# Patient Record
Sex: Male | Born: 1970
Health system: Southern US, Community
[De-identification: ages and names within clinical notes are randomized; demographics above are authoritative.]

## PROBLEM LIST (undated history)

## (undated) DIAGNOSIS — K5000 Crohn's disease of small intestine without complications: Secondary | ICD-10-CM

## (undated) DIAGNOSIS — D649 Anemia, unspecified: Secondary | ICD-10-CM

## (undated) DIAGNOSIS — K529 Noninfective gastroenteritis and colitis, unspecified: Secondary | ICD-10-CM

## (undated) DIAGNOSIS — E538 Deficiency of other specified B group vitamins: Secondary | ICD-10-CM

## (undated) HISTORY — DX: Anemia, unspecified: D64.9

## (undated) HISTORY — DX: Deficiency of other specified B group vitamins: E53.8

## (undated) HISTORY — DX: Crohn's disease of small intestine without complications: K50.00

## (undated) HISTORY — DX: Noninfective gastroenteritis and colitis, unspecified: K52.9

## (undated) HISTORY — PX: COLONOSCOPY: SHX174

---

## 2014-05-20 ENCOUNTER — Other Ambulatory Visit: Payer: Self-pay

## 2014-05-20 DIAGNOSIS — K50119 Crohn's disease of large intestine with unspecified complications: Secondary | ICD-10-CM

## 2014-05-25 ENCOUNTER — Other Ambulatory Visit (INDEPENDENT_AMBULATORY_CARE_PROVIDER_SITE_OTHER): Payer: 59

## 2014-05-25 DIAGNOSIS — K5010 Crohn's disease of large intestine without complications: Secondary | ICD-10-CM

## 2014-05-25 LAB — CBC WITH DIFFERENTIAL/PLATELET
BASOS PCT: 0.5 % (ref 0.0–3.0)
Basophils Absolute: 0 10*3/uL (ref 0.0–0.1)
EOS ABS: 0.2 10*3/uL (ref 0.0–0.7)
EOS PCT: 3.2 % (ref 0.0–5.0)
HCT: 44.3 % (ref 39.0–52.0)
Hemoglobin: 14.9 g/dL (ref 13.0–17.0)
LYMPHS PCT: 27.2 % (ref 12.0–46.0)
Lymphs Abs: 1.5 10*3/uL (ref 0.7–4.0)
MCHC: 33.6 g/dL (ref 30.0–36.0)
MCV: 83 fl (ref 78.0–100.0)
MONO ABS: 0.4 10*3/uL (ref 0.1–1.0)
Monocytes Relative: 8 % (ref 3.0–12.0)
Neutro Abs: 3.4 10*3/uL (ref 1.4–7.7)
Neutrophils Relative %: 61.1 % (ref 43.0–77.0)
PLATELETS: 280 10*3/uL (ref 150.0–400.0)
RBC: 5.33 Mil/uL (ref 4.22–5.81)
RDW: 12.9 % (ref 11.5–15.5)
WBC: 5.6 10*3/uL (ref 4.0–10.5)

## 2014-05-25 LAB — COMPREHENSIVE METABOLIC PANEL
ALBUMIN: 4.1 g/dL (ref 3.5–5.2)
ALK PHOS: 53 U/L (ref 39–117)
ALT: 28 U/L (ref 0–53)
AST: 30 U/L (ref 0–37)
BUN: 10 mg/dL (ref 6–23)
CO2: 23 mEq/L (ref 19–32)
Calcium: 9.2 mg/dL (ref 8.4–10.5)
Chloride: 106 mEq/L (ref 96–112)
Creatinine, Ser: 1.1 mg/dL (ref 0.4–1.5)
GFR: 81.31 mL/min (ref >60.00)
GLUCOSE: 86 mg/dL (ref 70–99)
POTASSIUM: 4.1 meq/L (ref 3.5–5.1)
SODIUM: 138 meq/L (ref 135–145)
Total Bilirubin: 0.7 mg/dL (ref 0.2–1.2)
Total Protein: 7.2 g/dL (ref 6.0–8.3)

## 2014-05-25 LAB — C-REACTIVE PROTEIN: CRP: <0.5 mg/dL (ref 0.5–20.0)

## 2014-05-27 LAB — IBD EXPANDED PANEL
ACCA: 6 units (ref 0–90)
ALCA: 40 units (ref 0–60)
AMCA: 39 U (ref 0–100)
ATYPICAL PANCA: NEGATIVE
gASCA: 8 units (ref 0–50)

## 2014-06-04 ENCOUNTER — Encounter: Payer: Self-pay | Admitting: Internal Medicine

## 2014-06-04 ENCOUNTER — Ambulatory Visit: Payer: Self-pay | Admitting: Internal Medicine

## 2014-06-04 ENCOUNTER — Other Ambulatory Visit: Payer: 59

## 2014-06-04 ENCOUNTER — Ambulatory Visit (INDEPENDENT_AMBULATORY_CARE_PROVIDER_SITE_OTHER): Payer: 59 | Admitting: Internal Medicine

## 2014-06-04 VITALS — BP 128/70 | HR 76 | Ht 68.0 in | Wt 226.1 lb

## 2014-06-04 DIAGNOSIS — K529 Noninfective gastroenteritis and colitis, unspecified: Secondary | ICD-10-CM

## 2014-06-04 DIAGNOSIS — E538 Deficiency of other specified B group vitamins: Secondary | ICD-10-CM

## 2014-06-04 DIAGNOSIS — K5289 Other specified noninfective gastroenteritis and colitis: Secondary | ICD-10-CM

## 2014-06-04 NOTE — Patient Instructions (Signed)
Please go to the basement for lab work today before leaving.  We will be in touch with results and plans.   We are providing you with copies of your labwork that has been done.    I appreciate the opportunity to care for you.

## 2014-06-04 NOTE — Progress Notes (Signed)
   Subjective:    Patient ID: Bruce Harrison, male    DOB: [birth year], 43 y.o.   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 Initially in [place] then in  [place] he has an evaluation by gastroenterology that demonstrated aphthous ulcers in the small intestine or a small bowel capsule endoscopy as well as a colonoscopy. Biopsy showed nonspecific inflammation. He has had about 4 episodes over the years. He has also noticed an intermittent rash on his elbows it seems to be better with less weak and in general even with some wheat or most of the week but not necessarily gluten. He has never been tested for celiac disease. He was started on Pentasa a number of years ago and takes 2-3 g daily, and since then has only had one mild episode. Rare minor mouth ulcers Takes B12 because he was deficient.  No Known Allergies  Medications:  Pentasa 2-3 g daily Vit B12 1000 ug biweekly IM  Past Medical History  Diagnosis Date  . Vitamin B12 deficiency   . Anemia   . IBD (inflammatory bowel disease)     ? type   Past Surgical History  Procedure Laterality Date  . Colonoscopy     History   Social History  . Marital Status: Married    Spouse Name: N/A    Number of Children: 2  . Years of Education: N/A   Occupational History  . MD    Social History Main Topics  . Smoking status: Never Smoker   . Smokeless tobacco: Never Used  . Alcohol Use: No  . Drug Use: No  . Sexual Activity: None   Married, 2 sons MD [employer]  History reviewed.family history of heart disease.  Review of Systems This is positive for those things mentioned in the HPI. All other review of systems are negative.      Objective:   Physical Exam General:  Well-developed, well-nourished  and in no acute distress Eyes:  anicteric. ENT:   Mouth and posterior pharynx free of lesions.   Lungs: Clear to auscultation bilaterally. Heart:  S1S2, no rubs, murmurs, gallops. Abdomen:  soft, non-tender, no hepatosplenomegaly, hernia, or mass and BS+.  Rectal: Lymph:  no cervical or supraclavicular adenopathy. Extremities:   no edema Skin   no rash. Neuro:  A&O x 3.  Psych:  appropriate mood and  Affect.   Data Reviewed:  Labs in EMR     Assessment & Plan:  Inflammatory bowel disease ? Crohn's vs other indeterminant Reasonable to look for celiac disease Will call with labs Still need to review prior w/u Need to get ROI signed - forgot to do so

## 2014-06-06 ENCOUNTER — Encounter: Payer: Self-pay | Admitting: Internal Medicine

## 2014-06-06 DIAGNOSIS — E538 Deficiency of other specified B group vitamins: Secondary | ICD-10-CM | POA: Insufficient documentation

## 2014-06-06 DIAGNOSIS — K5000 Crohn's disease of small intestine without complications: Secondary | ICD-10-CM

## 2014-06-06 HISTORY — DX: Crohn's disease of small intestine without complications: K50.00

## 2014-06-06 NOTE — Assessment & Plan Note (Signed)
?   Crohn's vs other indeterminant Reasonable to look for celiac disease Will call with labs Still need to review prior w/u Need to get ROI signed - forgot to do so

## 2014-06-08 LAB — CELIAC DISEASE COMPLETE PANEL
Antigliadin Abs, IgA: 11 units (ref 0–19)
Gliadin IgG: 2 units (ref 0–19)
IGA/IMMUNOGLOBULIN A, SERUM: 214 mg/dL (ref 91–414)
Tissue Transglut Ab: 2 U/mL (ref 0–5)
Transglutaminase IgA: <2 U/mL (ref 0–3)

## 2014-06-08 NOTE — Progress Notes (Signed)
Quick Note:  His celiac panel is negative I neglected to get his old records when he was here - need ROI for colonoscopy, egd and pathology results from GI w/u's in [place] ______

## 2014-07-12 ENCOUNTER — Telehealth: Payer: Self-pay | Admitting: Internal Medicine

## 2014-07-12 DIAGNOSIS — K5000 Crohn's disease of small intestine without complications: Secondary | ICD-10-CM

## 2014-07-13 ENCOUNTER — Encounter: Payer: Self-pay | Admitting: Internal Medicine

## 2014-07-13 NOTE — Telephone Encounter (Signed)
Have reviewed his outside records and it shows he has had some very mild ileitis, by biopsy at least once if not twice. Nonspecific. In 2006 originally with normal-looking ileum and then aphthous ulcers in 2007 a colonoscopy. He seems to do well on a low dose mesalamine treatment and we discussed the pros and cons of stopping versus restarting it and whether or not endoscopic evaluation is needed at this point. My recommendations are that he continue his low-dose Pentasa see me every one to 2 years and does not need a routine colonoscopy at this point he should have one at 56 routine colon cancer screening and then evaluate signs and symptoms as appropriate otherwise.

## 2014-10-05 ENCOUNTER — Ambulatory Visit (INDEPENDENT_AMBULATORY_CARE_PROVIDER_SITE_OTHER): Payer: 59 | Admitting: Internal Medicine

## 2014-10-05 ENCOUNTER — Encounter: Payer: Self-pay | Admitting: Internal Medicine

## 2014-10-05 VITALS — BP 144/77 | HR 81 | Wt 226.0 lb

## 2014-10-05 DIAGNOSIS — Z7721 Contact with and (suspected) exposure to potentially hazardous body fluids: Secondary | ICD-10-CM

## 2014-10-05 NOTE — Progress Notes (Signed)
   Subjective:    Patient ID: Bruce Harrison, male    DOB: 13-Feb-1971, 43 y.o.   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 Carlean Purl, GERD. roughly 6 wks ago, while moonlighting at [place], [place], during his last shift, he was placing a femoral line and while suturing the line he noticed he pricked his gloved hand with needle. He did not notice any blood drawn from inside of the glove. The source was 43 yo patient died of sepsis of aspiration pneumonia. No hx of hepatitis or hiv, although no acute testing panel done at the time of the needle stick  First couple weeks feeling fine. Started having a day or two of malaise in setting of being with exposed to sick contact (2 sons and wife). He was wondered if he had any blood borne pathogen   Hep B imms., booster in [place] 2-33yrs Last tested for HIV and HCV, both negative roughly 3 yrs.   Hepatitis B checked in annual screening in employee health in September    No Known Allergies Current Outpatient Prescriptions on File Prior to Visit  Medication Sig Dispense Refill  . levothyroxine (SYNTHROID, LEVOTHROID) 75 MCG tablet Take 75 mcg by mouth daily before breakfast.    . pantoprazole (PROTONIX) 40 MG tablet Take 40 mg by mouth daily.    . Cyanocobalamin (VITAMIN B-12 IJ) Inject 1,000 mcg as directed every 14 (fourteen) days.    . Mesalamine (PENTASA PO) Take 1,000 mg by mouth 4 (four) times daily.     No current facility-administered medications on file prior to visit.     Review of Systems + malaise, occ sore throat for < day.    Objective:   Physical Exam BP 144/77 mmHg  Pulse 81  Wt 226 lb (102.513 kg)  Physical Exam  Constitutional: He is oriented to person, place, and time. He appears well-developed and well-nourished. No distress.  HENT:  Mouth/Throat: Oropharynx is clear and moist. No oropharyngeal exudate.  Cardiovascular: Normal rate,  regular rhythm and normal heart sounds. Exam reveals no gallop and no friction rub.  No murmur heard.  Pulmonary/Chest: Effort normal and breath sounds normal. No respiratory distress. He has no wheezes.  Abdominal: Soft. Bowel sounds are normal. He exhibits no distension. There is no tenderness.  Lymphadenopathy:  He has no cervical adenopathy.  Neurological: He is alert and oriented to person, place, and time.  Skin: Skin is warm and dry. No rash noted. No erythema.  Psychiatric: He has a normal mood and affect. His behavior is normal.         Assessment & Plan:  -concern for blood borne pathogen exposure. Very low risk, unknown source but also needle stick did not draw blood.   - mild hypertension = likley related to anxiety for needlestick  Will check the following hep b s ag, hep b viral load, hep c ab, hep c viral load, hiv ab

## 2014-10-06 LAB — HEPATITIS B DNA, ULTRAQUANTITATIVE, PCR: HEPATITIS B DNA: NOT DETECTED [IU]/mL (ref <20)

## 2014-10-06 LAB — HIV ANTIBODY (ROUTINE TESTING W REFLEX): HIV 1&2 Ab, 4th Generation: NONREACTIVE

## 2014-10-06 LAB — HEPATITIS C RNA QUANTITATIVE: HCV Quantitative: NOT DETECTED IU/mL (ref <15)

## 2014-10-06 LAB — HEPATITIS C ANTIBODY: HCV AB: NEGATIVE

## 2014-10-06 LAB — HEPATITIS B SURFACE ANTIGEN: HEP B S AG: NEGATIVE

## 2015-11-08 MED FILL — SYNTHROID 75 MCG TABLET: 75 | 90 days supply | Qty: 90 | Fill #0

## 2015-11-08 MED FILL — CYANOCOBALAMIN 1,000 MCG/ML: 1000 | 90 days supply | Qty: 12 | Fill #0

## 2015-11-08 MED FILL — PANTOPRAZOLE SOD DR 40 MG T: 40 | 90 days supply | Qty: 180 | Fill #0

## 2016-01-12 MED FILL — BUDESONIDE EC 3 MG CAPSULE: 3 | 56 days supply | Qty: 168 | Fill #0

## 2016-01-12 MED FILL — predniSONE 10 MG TABS: 10 | 21 days supply | Qty: 84 | Fill #0

## 2016-02-03 MED FILL — SYNTHROID 75 MCG TABLET: 75 | 90 days supply | Qty: 90 | Fill #1

## 2016-02-08 MED FILL — PENTASA 500 MG CAPSULE: 500 | 90 days supply | Qty: 360 | Fill #0

## 2016-02-08 MED FILL — PANTOPRAZOLE SOD DR 40 MG T: 40 | 90 days supply | Qty: 180 | Fill #1

## 2016-05-14 MED FILL — SYNTHROID 75 MCG TABLET: 75 | 90 days supply | Qty: 90 | Fill #2

## 2016-08-15 MED FILL — SYNTHROID 75 MCG TABLET: 75 | 90 days supply | Qty: 90 | Fill #0

## 2016-10-24 MED FILL — AZITHROMYCIN 500 MG TABLET: 500 | 10 days supply | Qty: 10 | Fill #0

## 2016-10-24 MED FILL — METHYLPREDNISOLONE 4 MG TAB: 4 | 6 days supply | Qty: 21 | Fill #0

## 2016-10-26 MED FILL — OSELTAMIVIR PHOS 75 MG CAP: 75 | 5 days supply | Qty: 10 | Fill #0

## 2016-11-14 MED FILL — LEVOTHYROXINE 75 MCG TABLET: 75 | 90 days supply | Qty: 90 | Fill #0

## 2017-03-07 MED FILL — LEVOTHYROXINE 75 MCG TABLET: 75 | 90 days supply | Qty: 90 | Fill #1

## 2017-09-30 MED FILL — ROSUVASTATIN CALCIUM 10 MG: 10 | 90 days supply | Qty: 90 | Fill #0

## 2017-10-02 MED FILL — LEVOTHYROXINE 75 MCG TABLET: 75 | 90 days supply | Qty: 90 | Fill #0

## 2018-05-02 MED FILL — AMOX-CLAV 875-125 MG TABLET: 875-125 | 14 days supply | Qty: 28 | Fill #0

## 2018-08-15 MED FILL — AMOX-CLAV 875-125 MG TABLET: 875-125 | 7 days supply | Qty: 14 | Fill #0

## 2018-12-30 MED FILL — HYDROXYCHLOROQUINE SULFATE: 200 | 21 days supply | Qty: 42 | Fill #0

## 2019-04-24 MED FILL — BOOSTRIX VACCINE SYRINGE: 5-2.5-18.5 | 1 days supply | Qty: 1 | Fill #0

## 2020-03-24 ENCOUNTER — Ambulatory Visit (HOSPITAL_COMMUNITY)
Admission: RE | Admit: 2020-03-24 | Discharge: 2020-03-24 | Disposition: A | Discharge status: Home / Self Care | Payer: 59 | Source: Ambulatory Visit | Attending: Gastroenterology | Admitting: Gastroenterology

## 2020-03-24 ENCOUNTER — Other Ambulatory Visit: Payer: Self-pay | Admitting: Gastroenterology

## 2020-03-24 DIAGNOSIS — K760 Fatty (change of) liver, not elsewhere classified: Secondary | ICD-10-CM | POA: Diagnosis not present

## 2020-03-24 DIAGNOSIS — R1011 Right upper quadrant pain: Secondary | ICD-10-CM

## 2020-05-03 ENCOUNTER — Encounter: Payer: Self-pay | Admitting: Internal Medicine

## 2020-05-03 ENCOUNTER — Other Ambulatory Visit: Payer: Self-pay | Admitting: Internal Medicine

## 2020-05-03 DIAGNOSIS — E039 Hypothyroidism, unspecified: Secondary | ICD-10-CM

## 2020-05-03 DIAGNOSIS — K5000 Crohn's disease of small intestine without complications: Secondary | ICD-10-CM

## 2020-05-03 NOTE — Progress Notes (Signed)
Spoke to patient in person - needs lab f/u and to arrange a colonoscopy. Labs ordered as per orders We will contact him and arrange a colonoscopy as well.

## 2020-05-03 NOTE — Telephone Encounter (Signed)
This encounter was created in error - please disregard.

## 2020-06-20 ENCOUNTER — Other Ambulatory Visit: Payer: Self-pay | Admitting: Internal Medicine

## 2020-06-20 DIAGNOSIS — K5000 Crohn's disease of small intestine without complications: Secondary | ICD-10-CM

## 2020-06-20 DIAGNOSIS — E039 Hypothyroidism, unspecified: Secondary | ICD-10-CM

## 2020-06-20 NOTE — Progress Notes (Signed)
Personal communication with the patient of mine about checking labs as ordered regarding his Crohn's disease hypothyroidism general health maintenance and is having some right upper quadrant pain as well.

## 2020-06-22 DIAGNOSIS — K5900 Constipation, unspecified: Secondary | ICD-10-CM | POA: Diagnosis not present

## 2020-06-22 DIAGNOSIS — R1031 Right lower quadrant pain: Secondary | ICD-10-CM | POA: Diagnosis not present

## 2020-06-22 DIAGNOSIS — K529 Noninfective gastroenteritis and colitis, unspecified: Secondary | ICD-10-CM | POA: Diagnosis not present

## 2020-06-22 DIAGNOSIS — R140 Abdominal distension (gaseous): Secondary | ICD-10-CM | POA: Diagnosis not present

## 2020-06-27 ENCOUNTER — Other Ambulatory Visit (HOSPITAL_COMMUNITY): Payer: Self-pay | Admitting: Gastroenterology

## 2020-06-27 ENCOUNTER — Other Ambulatory Visit: Payer: Self-pay | Admitting: Gastroenterology

## 2020-06-27 DIAGNOSIS — R1031 Right lower quadrant pain: Secondary | ICD-10-CM

## 2020-06-28 ENCOUNTER — Other Ambulatory Visit: Payer: Self-pay

## 2020-06-28 ENCOUNTER — Ambulatory Visit (HOSPITAL_COMMUNITY)
Admission: RE | Admit: 2020-06-28 | Discharge: 2020-06-28 | Disposition: A | Discharge status: Home / Self Care | Payer: 59 | Source: Ambulatory Visit | Attending: Gastroenterology | Admitting: Gastroenterology

## 2020-06-28 DIAGNOSIS — R1031 Right lower quadrant pain: Secondary | ICD-10-CM | POA: Diagnosis not present

## 2020-06-28 DIAGNOSIS — I898 Other specified noninfective disorders of lymphatic vessels and lymph nodes: Secondary | ICD-10-CM | POA: Diagnosis not present

## 2020-06-28 DIAGNOSIS — K529 Noninfective gastroenteritis and colitis, unspecified: Secondary | ICD-10-CM | POA: Diagnosis not present

## 2020-06-28 MED ORDER — IOHEXOL 300 MG/ML  SOLN
100.0000 mL | Freq: Once | INTRAMUSCULAR | Status: AC | PRN
  Administered 2020-06-28: 100 mL via INTRAVENOUS

## 2021-03-21 ENCOUNTER — Other Ambulatory Visit (HOSPITAL_COMMUNITY): Payer: Self-pay

## 2021-03-21 MED ORDER — TETANUS-DIPHTH-ACELL PERTUSSIS 5-2.5-18.5 LF-MCG/0.5 IM SUSY
0.5000 mL | PREFILLED_SYRINGE | INTRAMUSCULAR | 0 refills | Status: DC
  Filled 2021-03-21: qty 0.5, 1d supply, fill #0

## 2021-03-23 ENCOUNTER — Other Ambulatory Visit (HOSPITAL_COMMUNITY): Payer: Self-pay

## 2021-04-06 IMAGING — CT CT ABD-PELV W/ CM
2 of 5 series · 16 of 46 positions shown, 18 images · IV contrast (Omni 300)
Comparison: 03/24/2020 abdominal sonogram.

CLINICAL DATA: Intermittent right abdominal discomfort. Patient
reports chronic mesalamine therapy for ileitis.

EXAM:
CT ABDOMEN AND PELVIS WITH CONTRAST
TECHNIQUE: Multidetector CT imaging of the abdomen and pelvis was performed
using the standard protocol following bolus administration of
intravenous contrast.
CONTRAST:  100mL OMNIPAQUE IOHEXOL 300 MG/ML  SOLN

[Series 3: a/p w/ 5mm · axial · 0.90mm/px · z∈[+911,+1336]mm · 13 of 97 slices shown, 15 images]
[im 6/97  soft-tissue]
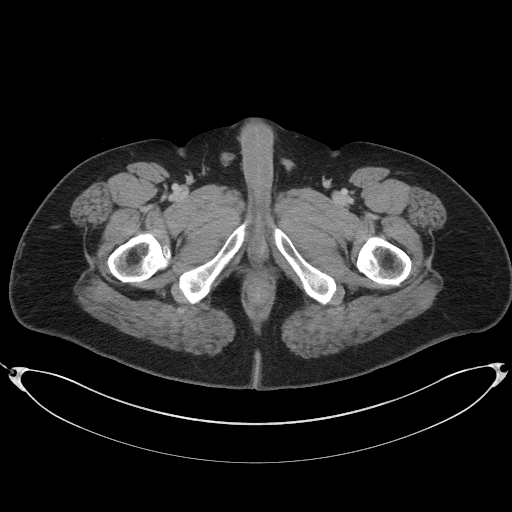
[im 6/97  bone]
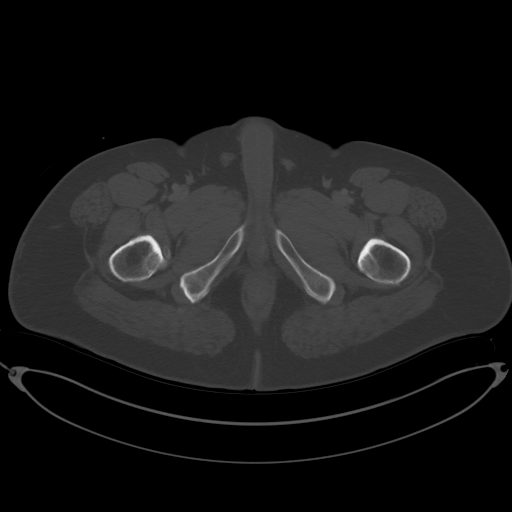
[im 12/97  soft-tissue]
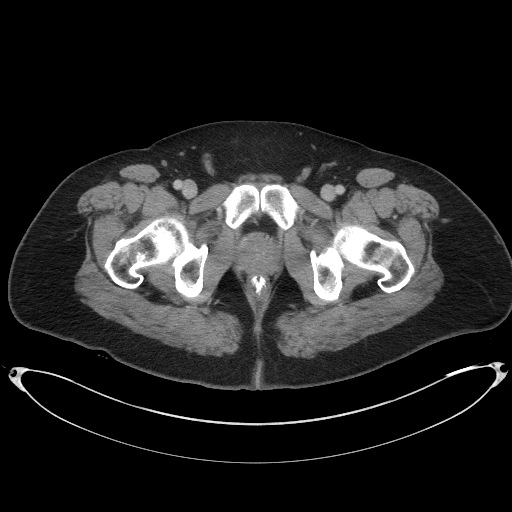
[im 23/97  soft-tissue]
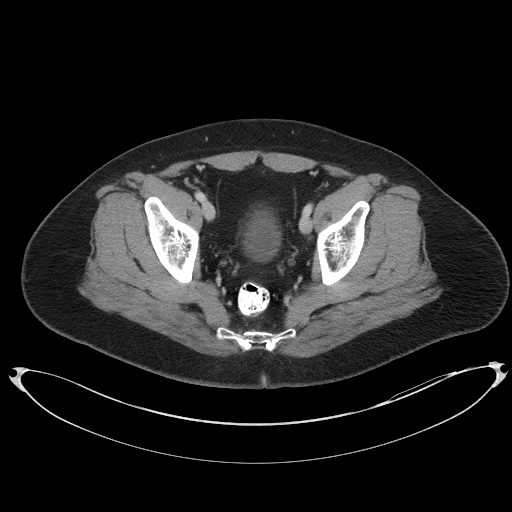
[im 29/97  soft-tissue]
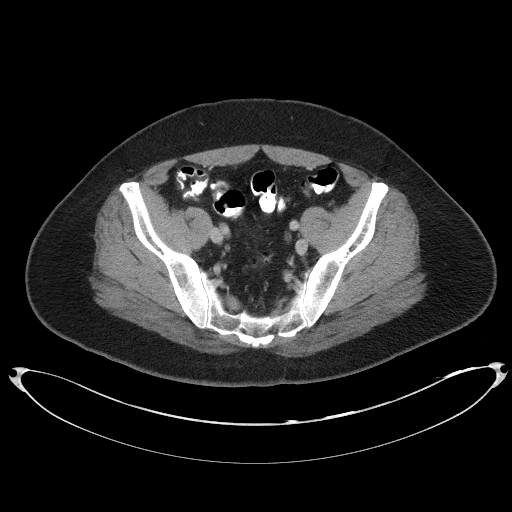
[im 34/97  soft-tissue]
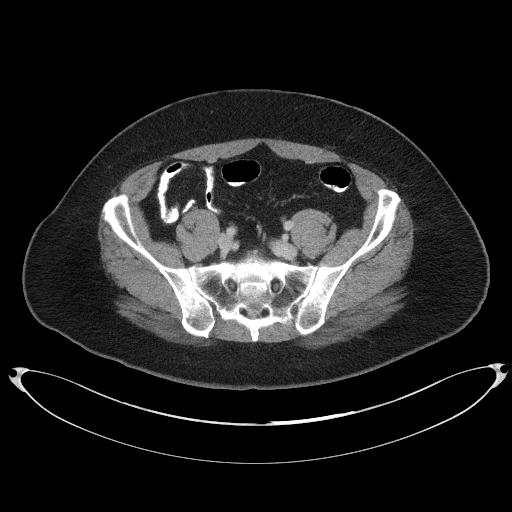
[im 40/97  soft-tissue]
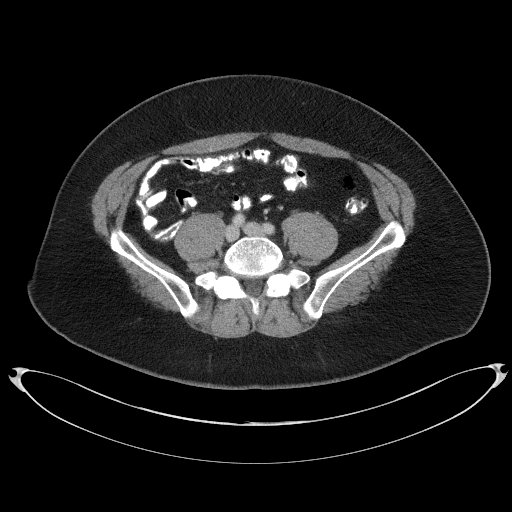
[im 51/97  soft-tissue]
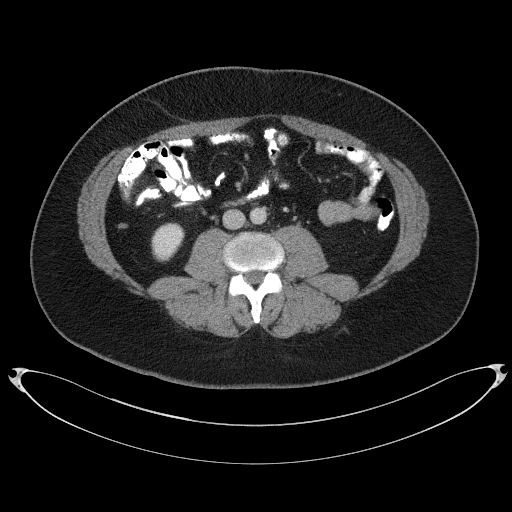
[im 57/97  soft-tissue]
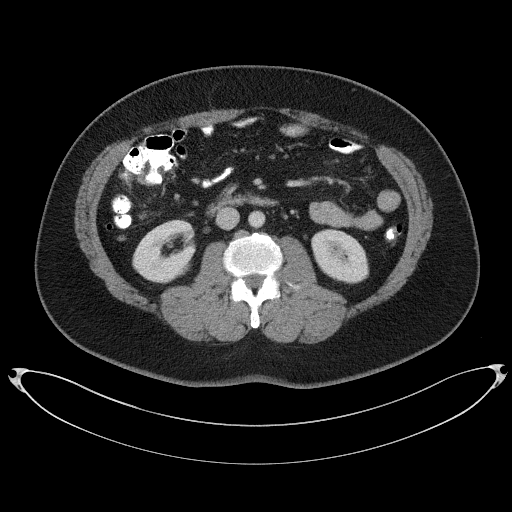
[im 63/97  soft-tissue]
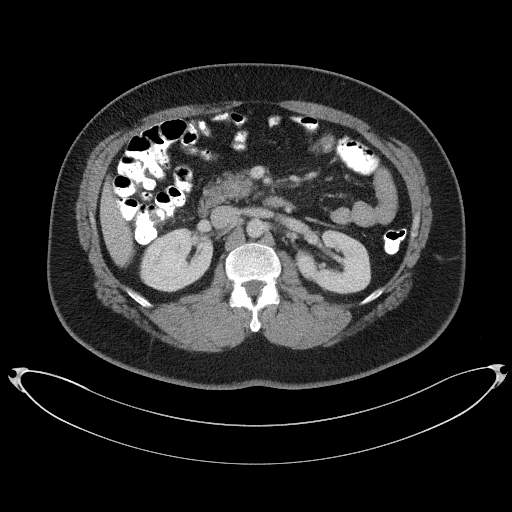
[im 63/97  bone]
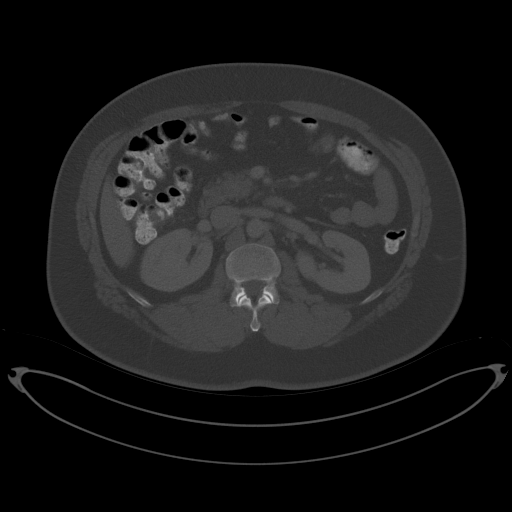
[im 68/97  soft-tissue]
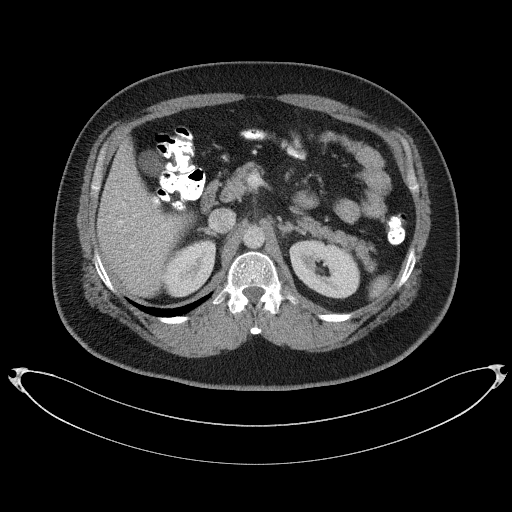
[im 74/97  soft-tissue]
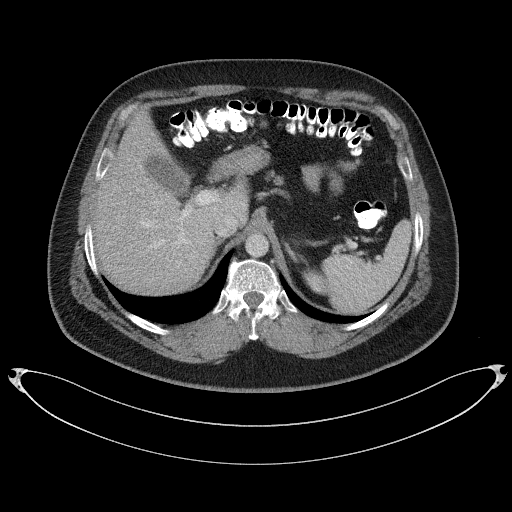
[im 85/97  soft-tissue]
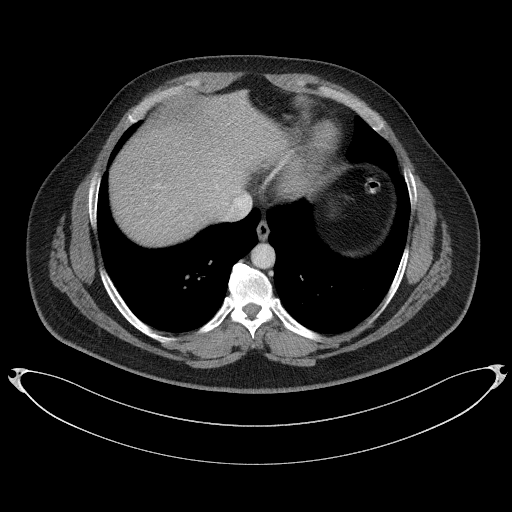
[im 91/97  soft-tissue]
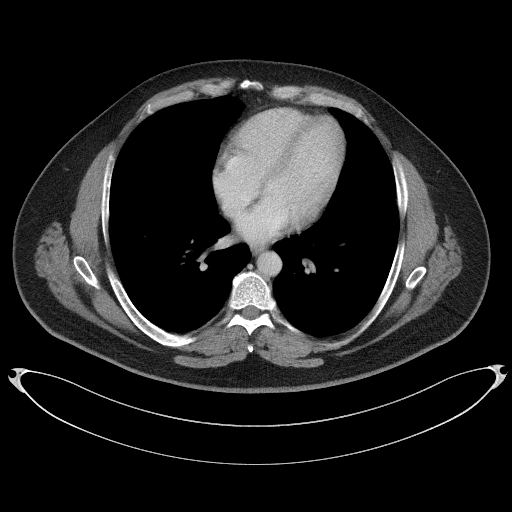

[Series 7: a/p w/ cor · coronal · 0.94mm/px · 3 of 165 slices shown]
[im 55/165  soft-tissue]
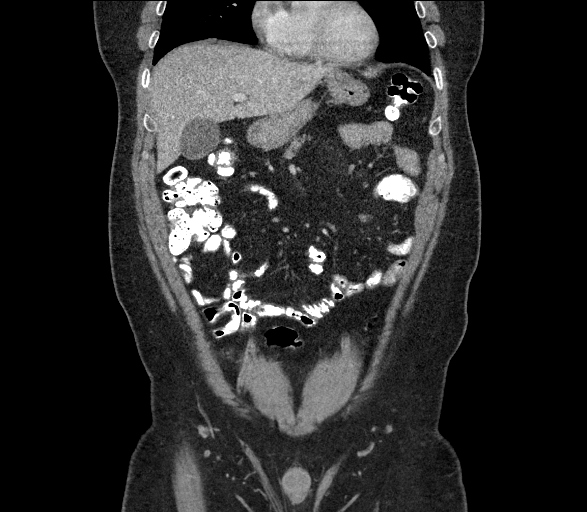
[im 73/165  soft-tissue]
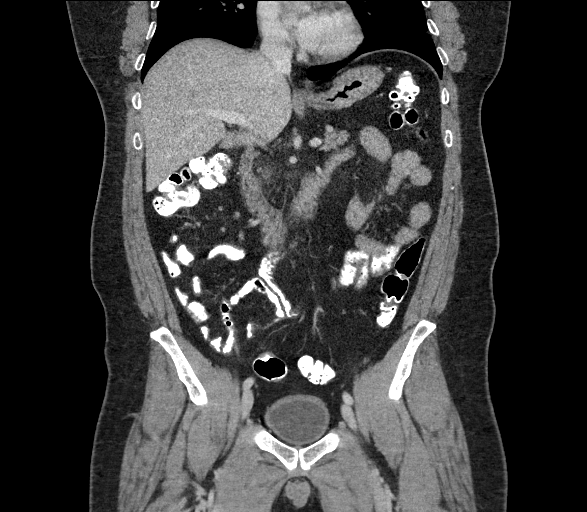
[im 92/165  soft-tissue]
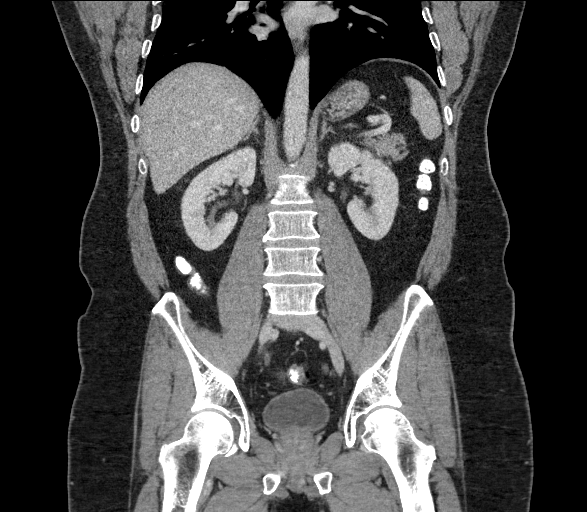

[16 of 46 positions shown; findings below may reference images not displayed]

FINDINGS: Lower chest: No significant pulmonary nodules or acute consolidative
airspace disease. Calcified granulomatous right infrahilar lymph
node.

Hepatobiliary: Normal liver size. No liver mass. Normal gallbladder
with no radiopaque cholelithiasis. No biliary ductal dilatation.

Pancreas: Normal, with no mass or duct dilation.

Spleen: Normal size. No mass.

Adrenals/Urinary Tract: Normal adrenals. Normal kidneys with no
hydronephrosis and no renal mass. Normal bladder.

Stomach/Bowel: Normal non-distended stomach. Normal caliber small
bowel with no small bowel wall thickening. Terminal ileum is within
normal limits. Normal appendix. Oral contrast transits to the colon.
Normal large bowel with no diverticulosis, large bowel wall
thickening or pericolonic fat stranding.

Vascular/Lymphatic: Normal caliber abdominal aorta. Patent portal,
splenic, hepatic and renal veins. No pathologically enlarged lymph
nodes in the abdomen or pelvis.

Reproductive: Normal size prostate.

Other: No pneumoperitoneum, ascites or focal fluid collection.

Musculoskeletal: No aggressive appearing focal osseous lesions. Mild
thoracolumbar spondylosis.
IMPRESSION: No acute abnormality. No evidence of bowel obstruction or acute
bowel inflammation. Normal appendix.

## 2021-04-11 ENCOUNTER — Other Ambulatory Visit (HOSPITAL_COMMUNITY): Payer: Self-pay

## 2021-05-10 DIAGNOSIS — K2970 Gastritis, unspecified, without bleeding: Secondary | ICD-10-CM | POA: Diagnosis not present

## 2021-05-10 DIAGNOSIS — K6389 Other specified diseases of intestine: Secondary | ICD-10-CM | POA: Diagnosis not present

## 2021-05-10 DIAGNOSIS — K5000 Crohn's disease of small intestine without complications: Secondary | ICD-10-CM | POA: Diagnosis not present

## 2021-05-10 DIAGNOSIS — Z1211 Encounter for screening for malignant neoplasm of colon: Secondary | ICD-10-CM | POA: Diagnosis not present

## 2021-05-10 DIAGNOSIS — K635 Polyp of colon: Secondary | ICD-10-CM | POA: Diagnosis not present

## 2021-05-10 DIAGNOSIS — K319 Disease of stomach and duodenum, unspecified: Secondary | ICD-10-CM | POA: Diagnosis not present

## 2021-05-10 DIAGNOSIS — D125 Benign neoplasm of sigmoid colon: Secondary | ICD-10-CM | POA: Diagnosis not present

## 2021-05-10 DIAGNOSIS — R12 Heartburn: Secondary | ICD-10-CM | POA: Diagnosis not present

## 2021-05-10 DIAGNOSIS — K2991 Gastroduodenitis, unspecified, with bleeding: Secondary | ICD-10-CM | POA: Diagnosis not present

## 2021-05-10 DIAGNOSIS — K633 Ulcer of intestine: Secondary | ICD-10-CM | POA: Diagnosis not present

## 2021-05-10 DIAGNOSIS — K529 Noninfective gastroenteritis and colitis, unspecified: Secondary | ICD-10-CM | POA: Diagnosis not present

## 2021-08-02 ENCOUNTER — Other Ambulatory Visit (HOSPITAL_COMMUNITY): Payer: Self-pay

## 2021-08-07 ENCOUNTER — Other Ambulatory Visit (HOSPITAL_COMMUNITY): Payer: Self-pay

## 2022-06-06 ENCOUNTER — Encounter: Payer: Self-pay | Admitting: Cardiology

## 2022-06-06 ENCOUNTER — Ambulatory Visit: Payer: 59 | Admitting: Cardiology

## 2022-06-06 VITALS — BP 145/95 | HR 79 | Temp 98.0°F | Resp 16 | Ht 68.0 in | Wt 240.0 lb

## 2022-06-06 DIAGNOSIS — R072 Precordial pain: Secondary | ICD-10-CM

## 2022-06-06 DIAGNOSIS — E7800 Pure hypercholesterolemia, unspecified: Secondary | ICD-10-CM

## 2022-06-06 DIAGNOSIS — R0989 Other specified symptoms and signs involving the circulatory and respiratory systems: Secondary | ICD-10-CM | POA: Diagnosis not present

## 2022-06-06 MED ORDER — LOSARTAN POTASSIUM 50 MG PO TABS: 50.0000 mg | ORAL_TABLET | Freq: Every evening | ORAL | 2 refills | Status: AC

## 2022-06-06 NOTE — Progress Notes (Unsigned)
Primary Physician/Referring:  Carol Ada, MD  Patient ID: Bruce Harrison, male    DOB: 04/26/1971, 51 y.o.   MRN: [id number]  Chief Complaint  Patient presents with   Chest Pain   New Patient (Initial Visit)   HPI:    Bruce Harrison  is a 50 y.o. [place] male patient who presents with episodes of precordial chest pain, occurs with or without exertion activities, sometimes relieved with burping, sometimes with exertion and at times doing routine activities or at rest lasting several minutes, not limiting his activities.  He has occasionally had tingling sensation in his left.  Past Medical History:  Diagnosis Date   Anemia    Crohn's ileitis ([place]) 06/06/2014   IBD (inflammatory bowel disease)    ? type   Vitamin B12 deficiency    Past Surgical History:  Procedure Laterality Date   COLONOSCOPY     Family History  Problem Relation Age of Onset   Hypertension Father     Social History   Tobacco Use   Smoking status: Never   Smokeless tobacco: Never  Substance Use Topics   Alcohol use: No   Marital Status: Married  ROS  Review of Systems  Cardiovascular:  Negative for chest pain, dyspnea on exertion and leg swelling.   Objective      06/06/2022   12:42 PM 06/06/2022   12:32 PM 10/05/2014    9:42 AM  Vitals with BMI  Height  5' 8"    Weight  240 lbs 226 lbs  BMI  78.2   Systolic 956 213 086  Diastolic 95 96 77  Pulse 79 74 81   Today's Vitals   06/06/22 1232 06/06/22 1242  BP: (!) 147/96 (!) 145/95  Pulse: 74 79  Resp: 16   Temp: 98 F (36.7 C)   SpO2: 97% 97%  Weight: 240 lb (108.9 kg)   Height: 5' 8"  (1.727 m)    Body mass index is 36.49 kg/m.   Physical Exam Constitutional:      Appearance: He is obese.     Comments: Muscular  Neck:     Vascular: No carotid bruit or JVD.  Cardiovascular:     Rate and Rhythm: Normal rate and regular rhythm.     Pulses: Intact distal pulses.     Heart sounds: Normal heart sounds. No murmur heard.     No gallop.  Pulmonary:     Effort: Pulmonary effort is normal.     Breath sounds: Normal breath sounds.  Abdominal:     General: Bowel sounds are normal.     Palpations: Abdomen is soft.  Musculoskeletal:     Right lower leg: No edema.     Left lower leg: No edema.     Medications and allergies  No Known Allergies   Medication list after today's encounter   Current Outpatient Medications:    Cyanocobalamin (VITAMIN B-12 IJ), Inject 1,000 mcg as directed every 14 (fourteen) days., Disp: , Rfl:    levothyroxine (SYNTHROID, LEVOTHROID) 75 MCG tablet, Take 75 mcg by mouth daily before breakfast., Disp: , Rfl:    losartan (COZAAR) 50 MG tablet, Take 1 tablet (50 mg total) by mouth every evening., Disp: 30 tablet, Rfl: 2   Mesalamine (PENTASA PO), Take 1,000 mg by mouth 4 (four) times daily., Disp: , Rfl:    pantoprazole (PROTONIX) 40 MG tablet, Take 40 mg by mouth daily., Disp: , Rfl:    rosuvastatin (CRESTOR) 20 MG tablet, Take 20  mg by mouth daily., Disp: , Rfl:   Laboratory examination:   External labs:   Labs 06/23/2020:  BUN 20, creatinine 0.92, EGFR 98/113 mL, serum glucose 98 mg.  Potassium 4.2.  LFTs normal.  Hb 14.6/HCT 44.7, platelets 276.  Total cholesterol 10/23/2020, triglycerides 134, HDL 47, LDL 52.  A1c 5.5%.  TSH normal at 1.870.  Radiology:    Cardiac Studies:   NA  EKG:   EKG normal sinus rhythm at rate of 70 beats minute, normal axis.  No evidence of ischemia, normal EKG.    Assessment     ICD-10-CM   1. Precordial pain  R07.2 EKG 12-Lead    PCV CARDIAC STRESS TEST    CT CARDIAC SCORING ([facility] LOCATIONS ONLY)    2. Hypercholesteremia  E78.00 CT CARDIAC SCORING ([facility] LOCATIONS ONLY)    Lipid Panel With LDL/HDL Ratio    Lipoprotein A (LPA)    Apo A1 + B + Ratio    3. Labile hypertension  I75.79 COMPLETE METABOLIC PANEL WITH GFR    losartan (COZAAR) 50 MG tablet       Orders Placed This Encounter  Procedures   CT CARDIAC SCORING ([facility]  LOCATIONS ONLY)    Standing Status:   Future    Standing Expiration Date:   08/06/2022    Order Specific Question:   Preferred imaging location?    Answer:   [facility]   Lipid Panel With LDL/HDL Ratio   COMPLETE METABOLIC PANEL WITH GFR   Lipoprotein A (LPA)   Apo A1 + B + Ratio   PCV CARDIAC STRESS TEST    Standing Status:   Future    Standing Expiration Date:   08/06/2022   EKG 12-Lead    Meds ordered this encounter  Medications   losartan (COZAAR) 50 MG tablet    Sig: Take 1 tablet (50 mg total) by mouth every evening.    Dispense:  30 tablet    Refill:  2    Medications Discontinued During This Encounter  Medication Reason   Tdap (BOOSTRIX) 5-2.5-18.5 LF-MCG/0.5 injection    carvedilol (COREG) 6.25 MG tablet Change in therapy     Recommendations:   Bruce Harrison is a 51 y.o. [place] male patient who presents with episodes of precordial chest pain, occurs with or without exertion activities, sometimes relieved with burping, sometimes with exertion and at times doing routine activities or at rest lasting several minutes, not limiting his activities.  He has occasionally had tingling sensation in his left.  Past medical history significant for hypercholesterolemia and prehypertension, presently on 6.25 mg of carvedilol.  I have recommended coronary calcium score and also routine treadmill exercise stress test to evaluate his symptoms.  We will discontinue carvedilol and switch him to losartan which has neuroendocrine effects.  Will obtain routine labs including lipids, CMP, APO A1/B+ ratio along with LPA.  I will see him back after this and make further recommendations.    Adrian Prows, MD, [facility] 06/07/2022, 11:12 PM Office: [phone]

## 2022-06-07 NOTE — Progress Notes (Signed)
EKG normal sinus rhythm at rate of 70 beats minute, normal axis. IRBBB.  No evidence of ischemia, normal EKG.

## 2022-06-08 ENCOUNTER — Ambulatory Visit: Payer: 59

## 2022-06-08 DIAGNOSIS — R072 Precordial pain: Secondary | ICD-10-CM | POA: Diagnosis not present

## 2022-06-09 LAB — PCV CARDIAC STRESS TEST
Angina Index: 0
ST Depression (mm): 0 mm

## 2022-08-02 ENCOUNTER — Ambulatory Visit: Payer: 59 | Admitting: Cardiology

## 2022-08-29 ENCOUNTER — Ambulatory Visit: Payer: 59 | Admitting: Cardiology

## 2023-05-14 DIAGNOSIS — R42 Dizziness and giddiness: Secondary | ICD-10-CM | POA: Diagnosis not present

## 2023-05-14 DIAGNOSIS — H9313 Tinnitus, bilateral: Secondary | ICD-10-CM | POA: Diagnosis not present

## 2023-05-14 DIAGNOSIS — H6123 Impacted cerumen, bilateral: Secondary | ICD-10-CM | POA: Diagnosis not present

## 2023-05-14 DIAGNOSIS — H903 Sensorineural hearing loss, bilateral: Secondary | ICD-10-CM | POA: Diagnosis not present

## 2023-11-01 ENCOUNTER — Other Ambulatory Visit (HOSPITAL_COMMUNITY): Payer: Self-pay

## 2023-11-01 MED ORDER — TETANUS-DIPHTH-ACELL PERTUSSIS 5-2.5-18.5 LF-MCG/0.5 IM SUSY
0.5000 mL | PREFILLED_SYRINGE | Freq: Once | INTRAMUSCULAR | 0 refills | Status: AC
  Filled 2023-11-01: qty 0.5, 1d supply, fill #0

## 2024-02-17 ENCOUNTER — Other Ambulatory Visit (INDEPENDENT_AMBULATORY_CARE_PROVIDER_SITE_OTHER): Payer: Self-pay | Admitting: Otolaryngology

## 2024-02-17 DIAGNOSIS — R221 Localized swelling, mass and lump, neck: Secondary | ICD-10-CM

## 2024-02-17 MED ORDER — FLUTICASONE PROPIONATE 50 MCG/ACT NA SUSP: 2.0000 | Freq: Every day | NASAL | 10 refills | Status: AC

## 2024-02-17 MED ORDER — PREDNISONE 10 MG (21) PO TBPK: ORAL_TABLET | ORAL | 0 refills | Status: AC

## 2024-02-18 ENCOUNTER — Ambulatory Visit (HOSPITAL_COMMUNITY)
Admission: RE | Admit: 2024-02-18 | Discharge: 2024-02-18 | Disposition: A | Discharge status: Home / Self Care | Source: Ambulatory Visit | Attending: Otolaryngology | Admitting: Otolaryngology

## 2024-02-18 ENCOUNTER — Other Ambulatory Visit (INDEPENDENT_AMBULATORY_CARE_PROVIDER_SITE_OTHER): Payer: Self-pay | Admitting: Otolaryngology

## 2024-02-18 DIAGNOSIS — K118 Other diseases of salivary glands: Secondary | ICD-10-CM | POA: Diagnosis not present

## 2024-02-18 DIAGNOSIS — R221 Localized swelling, mass and lump, neck: Secondary | ICD-10-CM

## 2024-02-18 MED ORDER — IOHEXOL 350 MG/ML SOLN
75.0000 mL | Freq: Once | INTRAVENOUS | Status: AC | PRN
  Administered 2024-02-18: 75 mL via INTRAVENOUS

## 2024-02-25 ENCOUNTER — Other Ambulatory Visit (HOSPITAL_COMMUNITY): Payer: Self-pay

## 2024-06-19 ENCOUNTER — Ambulatory Visit (INDEPENDENT_AMBULATORY_CARE_PROVIDER_SITE_OTHER): Admitting: Otolaryngology

## 2024-06-19 ENCOUNTER — Encounter (INDEPENDENT_AMBULATORY_CARE_PROVIDER_SITE_OTHER): Payer: Self-pay | Admitting: Otolaryngology

## 2024-06-19 VITALS — BP 130/84 | HR 64

## 2024-06-19 DIAGNOSIS — M542 Cervicalgia: Secondary | ICD-10-CM | POA: Diagnosis not present

## 2024-06-19 DIAGNOSIS — H6123 Impacted cerumen, bilateral: Secondary | ICD-10-CM | POA: Diagnosis not present

## 2024-06-19 DIAGNOSIS — M2420 Disorder of ligament, unspecified site: Secondary | ICD-10-CM

## 2024-06-19 DIAGNOSIS — R070 Pain in throat: Secondary | ICD-10-CM

## 2024-06-21 DIAGNOSIS — H6123 Impacted cerumen, bilateral: Secondary | ICD-10-CM | POA: Insufficient documentation

## 2024-06-21 DIAGNOSIS — R070 Pain in throat: Secondary | ICD-10-CM | POA: Insufficient documentation

## 2024-06-21 DIAGNOSIS — M2420 Disorder of ligament, unspecified site: Secondary | ICD-10-CM | POA: Insufficient documentation

## 2024-06-21 NOTE — Progress Notes (Signed)
 Patient ID: Bruce Harrison, male   DOB: 08/07/1971, 53 y.o.   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  He describes a pressure sensation in his right infra-auricular area.  The discomfort radiates down to his right neck and throat.  He recently underwent a neck CT scan.  The CT showed calcification of bilateral stylohyoid ligament, worse on the right side.  No other soft tissue abnormality is noted.  The patient denies any significant dysphagia, odynophagia, or dyspnea.  Exam: General: Communicates without difficulty, well nourished, no acute distress. Head: Normocephalic, no evidence injury, no tenderness, facial buttresses intact without stepoff. Face/sinus: No tenderness to palpation and percussion. Facial movement is normal and symmetric. Eyes: PERRL, EOMI. No scleral icterus, conjunctivae clear. Neuro: CN II exam reveals vision grossly intact.  No nystagmus at any point of gaze. Ears: Auricles well formed without lesions.  Bilateral cerumen impaction.  Nose: External evaluation reveals normal support and skin without lesions.  Dorsum is intact.  Anterior rhinoscopy reveals congested mucosa over anterior aspect of inferior turbinates and intact septum.  No purulence noted. Oral:  Oral cavity and oropharynx are intact, symmetric, without erythema or edema.  Mucosa is moist without lesions. Neck: Full range of motion without pain.  There is no significant lymphadenopathy.  No masses palpable.  Thyroid  bed within normal limits to palpation.  Parotid glands and submandibular glands equal bilaterally without mass.  Trachea is midline. Neuro:  CN 2-12 grossly intact.   Procedure: Bilateral cerumen disimpaction Anesthesia: None Description: Under the operating microscope, the cerumen is carefully removed with a combination of cerumen currette, alligator forceps, and  suction catheters.  After the cerumen is removed, the TMs are noted to be normal.  No mass, erythema, or lesions. The patient tolerated the procedure well.    Assessment: 1.  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  He has significant calcification of the stylohyoid ligament. 2.  Bilateral cerumen impaction.  Plan: 1.  Otomicroscopy with bilateral cerumen disimpaction. 2.  The physical exam findings and the CT images are reviewed with the patient. 3.  The pathophysiology of Eagle syndrome is discussed with the patient.  Questions are invited and answered. 4.  The patient is encouraged to call with any questions or concerns.

## 2024-07-06 ENCOUNTER — Other Ambulatory Visit: Payer: Self-pay

## 2024-07-06 ENCOUNTER — Other Ambulatory Visit (HOSPITAL_COMMUNITY): Payer: Self-pay

## 2024-07-06 MED ORDER — PANTOPRAZOLE SODIUM 40 MG PO TBEC
40.0000 mg | DELAYED_RELEASE_TABLET | Freq: Every day | ORAL | 2 refills | Status: AC
  Filled 2024-07-06 (×3): qty 90, 90d supply, fill #0

## 2024-07-06 MED ORDER — ENALAPRIL MALEATE 5 MG PO TABS
5.0000 mg | ORAL_TABLET | Freq: Every day | ORAL | 2 refills | Status: AC
  Filled 2024-07-06: qty 90, 90d supply, fill #0

## 2024-07-06 MED ORDER — CARVEDILOL 6.25 MG PO TABS
6.2500 mg | ORAL_TABLET | Freq: Two times a day (BID) | ORAL | 2 refills | Status: AC
  Filled 2024-07-06 (×2): qty 180, 90d supply, fill #0

## 2024-07-06 MED ORDER — LEVOTHYROXINE SODIUM 75 MCG PO TABS
75.0000 ug | ORAL_TABLET | Freq: Every day | ORAL | 2 refills | Status: AC
  Filled 2024-07-06 (×2): qty 90, 90d supply, fill #0

## 2024-07-06 MED ORDER — ROSUVASTATIN CALCIUM 20 MG PO TABS
20.0000 mg | ORAL_TABLET | Freq: Every day | ORAL | 2 refills | Status: AC
  Filled 2024-07-06 (×2): qty 90, 90d supply, fill #0

## 2024-07-07 ENCOUNTER — Encounter: Payer: Self-pay | Admitting: Pharmacist

## 2024-07-07 ENCOUNTER — Other Ambulatory Visit: Payer: Self-pay

## 2024-07-10 ENCOUNTER — Other Ambulatory Visit: Payer: Self-pay
